# Patient Record
Sex: Male | Born: 1990 | Race: White | Hispanic: No | Marital: Single | Smoking: Current some day smoker
Health system: Southern US, Community
[De-identification: ages and names within clinical notes are randomized; demographics above are authoritative.]

---

## 2016-09-29 ENCOUNTER — Encounter (HOSPITAL_COMMUNITY): Payer: Self-pay | Admitting: Nurse Practitioner

## 2016-09-29 ENCOUNTER — Emergency Department (HOSPITAL_COMMUNITY)
Admission: EM | Admit: 2016-09-29 | Discharge: 2016-09-29 | Disposition: A | Discharge status: Home / Self Care | Payer: 59 | Attending: Emergency Medicine | Admitting: Emergency Medicine

## 2016-09-29 ENCOUNTER — Emergency Department (HOSPITAL_COMMUNITY): Payer: 59

## 2016-09-29 DIAGNOSIS — Y9341 Activity, dancing: Secondary | ICD-10-CM | POA: Insufficient documentation

## 2016-09-29 DIAGNOSIS — Y999 Unspecified external cause status: Secondary | ICD-10-CM | POA: Diagnosis not present

## 2016-09-29 DIAGNOSIS — S42022A Displaced fracture of shaft of left clavicle, initial encounter for closed fracture: Secondary | ICD-10-CM | POA: Diagnosis not present

## 2016-09-29 DIAGNOSIS — W19XXXA Unspecified fall, initial encounter: Secondary | ICD-10-CM

## 2016-09-29 DIAGNOSIS — F17200 Nicotine dependence, unspecified, uncomplicated: Secondary | ICD-10-CM | POA: Diagnosis not present

## 2016-09-29 DIAGNOSIS — Y9289 Other specified places as the place of occurrence of the external cause: Secondary | ICD-10-CM | POA: Diagnosis not present

## 2016-09-29 DIAGNOSIS — W2209XA Striking against other stationary object, initial encounter: Secondary | ICD-10-CM | POA: Diagnosis not present

## 2016-09-29 DIAGNOSIS — S4992XA Unspecified injury of left shoulder and upper arm, initial encounter: Secondary | ICD-10-CM | POA: Diagnosis present

## 2016-09-29 MED ORDER — METHOCARBAMOL 500 MG PO TABS: 500.0000 mg | ORAL_TABLET | Freq: Two times a day (BID) | ORAL | 0 refills | Status: AC

## 2016-09-29 MED ORDER — METHOCARBAMOL 500 MG PO TABS
750.0000 mg | ORAL_TABLET | Freq: Once | ORAL | Status: AC
  Administered 2016-09-29: 750 mg via ORAL
  Filled 2016-09-29: qty 2

## 2016-09-29 MED ORDER — OXYCODONE-ACETAMINOPHEN 5-325 MG PO TABS: 2.0000 | ORAL_TABLET | ORAL | 0 refills | Status: AC | PRN

## 2016-09-29 MED ORDER — HYDROCODONE-ACETAMINOPHEN 5-325 MG PO TABS
2.0000 | ORAL_TABLET | Freq: Once | ORAL | Status: AC
  Administered 2016-09-29: 2 via ORAL
  Filled 2016-09-29: qty 2

## 2016-09-29 NOTE — ED Triage Notes (Signed)
Pt presents with c/o headache. The headache began after he fell and hit his head on a pool table on Saturday. He reports dizziness, feeling uncoordinated, unable to sleep, nausea, and vomiting since the accident. He also c/o L shoulder pain from the fall.

## 2016-09-29 NOTE — ED Notes (Signed)
Visual acuity checked. Bilateral 20/20, Right 20/20, Left 20/20. Pt stated "slight blurriness in upper aspect of the left eye" but does not impede vision.

## 2016-09-29 NOTE — ED Notes (Signed)
Pt was able to tolerate eating and drinking normally. Pt ambulated in hallway. Denies dizziness, unstable gait, or difficulty walking or breathing. States he feels "normal"

## 2016-09-29 NOTE — ED Notes (Signed)
Patient transported to X-ray 

## 2016-09-29 NOTE — ED Provider Notes (Signed)
[facility]-EMERGENCY DEPT Provider Note   CSN: [id number] Arrival date & time: 09/29/16  1236     History   Chief Complaint Chief Complaint  Patient presents with  . Head Injury  . Shoulder Injury    HPI Allen Herman is a 26 y.o. male presents to the ED reporting headache and left shoulder pain s/p mechanical fall 6 days ago. Associated left eye blurred vision, nausea, vomiting, dizziness, dull gradual in onset headache, one brief episode of "balance problems", difficulty sleeping due to shoulder pain. Patient states that last weekend he was at a bar, was dancing and tripped falling on the corner of a pool table striking the left side of his head and shoulder. Patient states that he was intoxicated, does not remember many details of the fall his friends told him he was out for ~5 minutes. Patient remembers the immediate preceding events before the fall, remembers waking up to the paramedics. No anticoagulants. No recent TBI or concussion in the last 4-6 weeks. Patient states that all of his symptoms are progressively improving however his left shoulder pain is only getting worse. No longer nauseous, vomiting or balance issues.  Headache improving.  No numbness, tingling or weakness to the left upper extremity. Patient has been taken Tylenol with minimal relief of left shoulder pain.  HPI  History reviewed. No pertinent past medical history.  There are no active problems to display for this patient.   History reviewed. No pertinent surgical history.     Home Medications    Prior to Admission medications   Medication Sig Start Date End Date Taking? Authorizing Provider  methocarbamol (ROBAXIN) 500 MG tablet Take 1 tablet (500 mg total) by mouth 2 (two) times daily. 09/29/16   Liberty Handylaudia J Marsel Gail, PA-C  oxyCODONE-acetaminophen (PERCOCET/ROXICET) 5-325 MG tablet Take 2 tablets by mouth every 4 (four) hours as needed for severe pain. 09/29/16   Liberty Handylaudia J Ariq Khamis, PA-C    Family  History History reviewed. No pertinent family history.  Social History Social History  Substance Use Topics  . Smoking status: Current Some Day Smoker  . Smokeless tobacco: Never Used  . Alcohol use Yes     Allergies   Patient has no known allergies.   Review of Systems Review of Systems  Constitutional: Negative for chills and fever.  HENT: Negative for congestion and sore throat.   Eyes: Positive for visual disturbance. Negative for photophobia, pain and redness.  Respiratory: Negative for cough and shortness of breath.   Cardiovascular: Negative for chest pain and palpitations.  Gastrointestinal: Positive for nausea and vomiting. Negative for abdominal pain, blood in stool, constipation and diarrhea.  Genitourinary: Negative for difficulty urinating, flank pain and hematuria.  Musculoskeletal: Positive for arthralgias and neck pain. Negative for joint swelling, myalgias and neck stiffness.  Skin: Positive for color change and wound. Negative for rash.  Neurological: Positive for headaches. Negative for dizziness, seizures, syncope, weakness, light-headedness and numbness.  Hematological: Negative.   Psychiatric/Behavioral: Negative.      Physical Exam Updated Vital Signs BP (!) 152/137   Pulse (!) 104   Temp 98.4 F (36.9 C) (Oral)   Resp (!) 22   Ht 6\' 2"  (1.88 m)   Wt 97.5 kg   SpO2 100%   BMI 27.60 kg/m   Physical Exam  Constitutional: He is oriented to person, place, and time. He appears well-developed and well-nourished. He is cooperative.  Non-toxic appearance. No distress.  HENT:  Head: Normocephalic and atraumatic.  Nose: Nose  normal.  Mouth/Throat: Oropharynx is clear and moist and mucous membranes are normal. No oropharyngeal exudate or posterior oropharyngeal erythema. Tonsils are 1+ on the right. Tonsils are 1+ on the left.  No Raccoon's eyes.  No Battle's sign. Green ecchymosis above left eye brow with tiny <0.5cm crusted abrasion. No  hemotympanum.  Temporal bones non tender.  Occipital prominence non tender. No nasal bone tenderness. Septum midline without hematoma.  Eyes: Conjunctivae, EOM and lids are normal. Pupils are equal, round, and reactive to light.  PERRL and EOMs intact. No pain with EOMs.  No nystagmus.  Globes soft without tenderness.  Neck: Normal range of motion and full passive range of motion without pain. Neck supple. No JVD present. Muscular tenderness present. No spinous process tenderness present. No neck rigidity. No tracheal deviation and normal range of motion present.    Muscular tenderness, full active neck ROM. No cervical spinous process tenderness.  No meningeal signs.  No carotid bruits.  Cardiovascular: Normal rate, regular rhythm, S1 normal, S2 normal, normal heart sounds and intact distal pulses.   No murmur heard. Pulses:      Carotid pulses are 2+ on the right side, and 2+ on the left side.      Radial pulses are 2+ on the right side, and 2+ on the left side.       Dorsalis pedis pulses are 2+ on the right side, and 2+ on the left side.  Strong left radial and ulnar pulses  Pulmonary/Chest: Effort normal and breath sounds normal. No respiratory distress. He has no decreased breath sounds. He has no wheezes. He has no rhonchi. He has no rales. He exhibits no tenderness.  Abdominal: Soft. Bowel sounds are normal. There is no tenderness.  Musculoskeletal: He exhibits no deformity.       Left shoulder: He exhibits decreased range of motion, tenderness, bony tenderness, pain and decreased strength. He exhibits no crepitus.       Arms: Tenderness at distal left clavicle, biceps tendon groove and anterior deltoid.  Green ecchymosis over distal left clavicle and anterior shoulder  Full active left shoulder abduction and adduction, and forearm pronation and supination  Decreased active left shoulder flexion and extension  Lymphadenopathy:    He has no cervical adenopathy.  Neurological:  He is alert and oriented to person, place, and time.  5/5 strength with shoulder raise, abduction and adduction, bilaterally.  4/5 strength with left shoulder flexion  Good pincer and hand grip bilaterally.  Sensation to light touch intact in median, ulnar and radial nerve distribution, bilaterally.   Skin: Skin is warm and dry. Capillary refill takes less than 2 seconds.  Psychiatric: He has a normal mood and affect. His behavior is normal. Judgment and thought content normal.  Nursing note and vitals reviewed.    ED Treatments / Results  Labs (all labs ordered are listed, but only abnormal results are displayed) Labs Reviewed - No data to display  EKG  EKG Interpretation None       Radiology Dg Clavicle Left  Result Date: 09/29/2016 CLINICAL DATA:  Superior and anterior left shoulder pain after falling and hitting his left arm on a pool table. EXAM: LEFT CLAVICLE - 2+ VIEWS COMPARISON:  Left shoulder radiographs obtained at the same time. FINDINGS: Fracture of the distal aspect of the left clavicle with very minimal superior displacement of the distal fragment. IMPRESSION: Minimally displaced distal clavicle fracture. Electronically Signed   By: Beckie Salts M.D.   On: 09/29/2016  17:23   Dg Shoulder Left  Result Date: 09/29/2016 CLINICAL DATA:  Superior and anterior left shoulder pain after falling and hitting his left arm on a pool table. EXAM: LEFT SHOULDER - 2+ VIEW COMPARISON:  Left clavicle radiographs obtained at the same time. FINDINGS: Previously described minimally displaced distal left clavicle fracture. No glenohumeral fracture or dislocation. IMPRESSION: Minimally displaced distal left clavicle fracture. Electronically Signed   By: Beckie Salts M.D.   On: 09/29/2016 17:24    Procedures Procedures (including critical care time)  Medications Ordered in ED Medications  HYDROcodone-acetaminophen (NORCO/VICODIN) 5-325 MG per tablet 2 tablet (2 tablets Oral Given 09/29/16  1654)  methocarbamol (ROBAXIN) tablet 750 mg (750 mg Oral Given 09/29/16 1655)     Initial Impression / Assessment and Plan / ED Course  I have reviewed the triage vital signs and the nursing notes.  Pertinent labs & imaging results that were available during my care of the patient were reviewed by me and considered in my medical decision making (see chart for details).  Clinical Course as of Sep 30 2049  Fri Sep 29, 2016  1729 FINDINGS: Fracture of the distal aspect of the left clavicle with very minimal superior displacement of the distal fragment.  IMPRESSION: Minimally displaced distal clavicle fracture. DG Clavicle Left [CG]  1730 FINDINGS: Previously described minimally displaced distal left clavicle fracture. No glenohumeral fracture or dislocation.  IMPRESSION: Minimally displaced distal left clavicle fracture. DG Shoulder Left [CG]    Clinical Course User Index [CG] Liberty Handy, PA-C   Injury occurred 6 days ago with mostly resolved symptoms. C-spine cleared with Nexus criteria. Discussed CT scan of head with supervising physician.  Given reassuring vital signs, no focal neurological findings on exam, no thunderclap sudden or worsening headache, vision changes, confusion, lethargy, seizures, slurred speech, memory deficits, disorientation I have low suspicion of acute intracranial injury.  No signs of basilar skull fracture.  CT scan was not considered indicated.  Long discussion with patient discussing risks vs benefits of CT scan today, shared decision making took place. Provided my recommendations and patient was agreeable to withhold CT scan head.  Considered carotid dissection but no sudden onset neck pain or headache immediately after fall, muscular tenderness "feels better with massage" and is improving. Patient considered safe for discharge with close f/u with PCP and orthopedist for distal left clavicular fracture.  Sling in place. Short course of percocet given.  Strict ED return precautions.  Patient agreeable. Patient tolerated PO challenge, ambulated in unit without complications.   Final Clinical Impressions(s) / ED Diagnoses   Final diagnoses:  Fall, initial encounter  Closed displaced fracture of shaft of left clavicle, initial encounter    New Prescriptions Discharge Medication List as of 09/29/2016  6:51 PM    START taking these medications   Details  oxyCODONE-acetaminophen (PERCOCET/ROXICET) 5-325 MG tablet Take 2 tablets by mouth every 4 (four) hours as needed for severe pain., Starting Fri 09/29/2016, Print         Liberty Handy, PA-C 09/30/16 2052    Mancel Bale, MD 10/02/16 [phone]

## 2016-09-29 NOTE — Progress Notes (Signed)
Orthopedic Tech Progress Note Patient Details:  [facility]ell [facility] 26-Mar-1991 [id number]  Ortho Devices Type of Ortho Device: Shoulder immobilizer Ortho Device/Splint Location: LUE Ortho Device/Splint Interventions: Ordered, Application   Jennye MoccasinHughes, Shonn Farruggia Craig 09/29/2016, 7:09 PM

## 2016-09-29 NOTE — ED Notes (Signed)
Ortho tech at bedside 

## 2016-09-29 NOTE — Discharge Instructions (Signed)
As we discussed your x-ray showed a distal clavicle fracture. Please wear your sling, take prescribed Percocet for severe pain and ice/heat for comfort and inflammation. If your pain is mild or moderate you may take 600 mg of ibuprofen +1000 mg of Tylenol up to 3 times a day. Please call orthopedic surgeon to make an appointment for re-evaluation within 7-10 days to ensure that your clavicle is healing well.  We had a long discussion today about the need of a CT scan of your head given your recent fall. After long discussion with Dr. Effie ShyWentz, we recommended conservative approach today and did not perform a CT scan. Please monitor your symptoms and ensure that they are improving and eventually resolved. Return to the emergency department if you develop severe headache, not intractable nausea or vomiting or any other neurological deficits.

## 2016-12-06 ENCOUNTER — Other Ambulatory Visit: Payer: Self-pay | Admitting: Family Medicine

## 2016-12-06 DIAGNOSIS — R740 Nonspecific elevation of levels of transaminase and lactic acid dehydrogenase [LDH]: Principal | ICD-10-CM

## 2016-12-06 DIAGNOSIS — R7401 Elevation of levels of liver transaminase levels: Secondary | ICD-10-CM

## 2016-12-14 ENCOUNTER — Other Ambulatory Visit: Payer: 59

## 2016-12-18 ENCOUNTER — Other Ambulatory Visit: Payer: 59

## 2018-01-14 IMAGING — DX DG CLAVICLE*L*
2 series · 2 of 2 positions shown · non-contrast
Comparison: Left shoulder radiographs obtained at the same time.

CLINICAL DATA: Superior and anterior left shoulder pain after
falling and hitting his left arm on a pool table.

EXAM:
LEFT CLAVICLE - 2+ VIEWS

[clavicle ap]
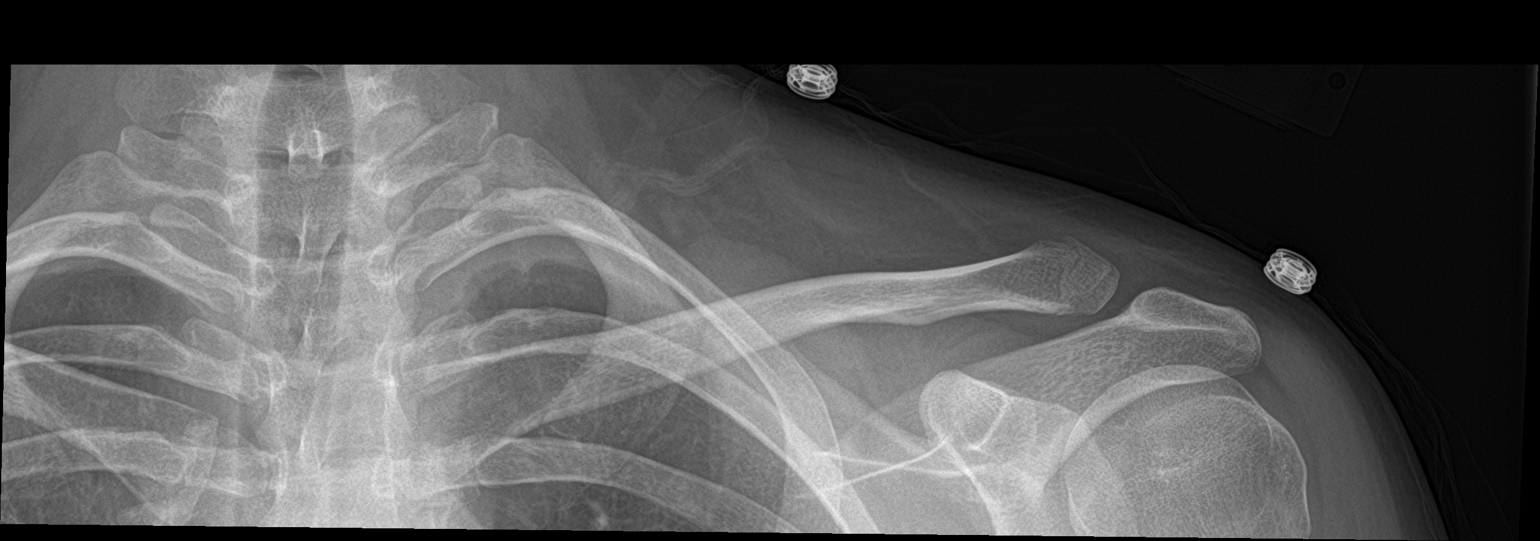

[clavicle axial]
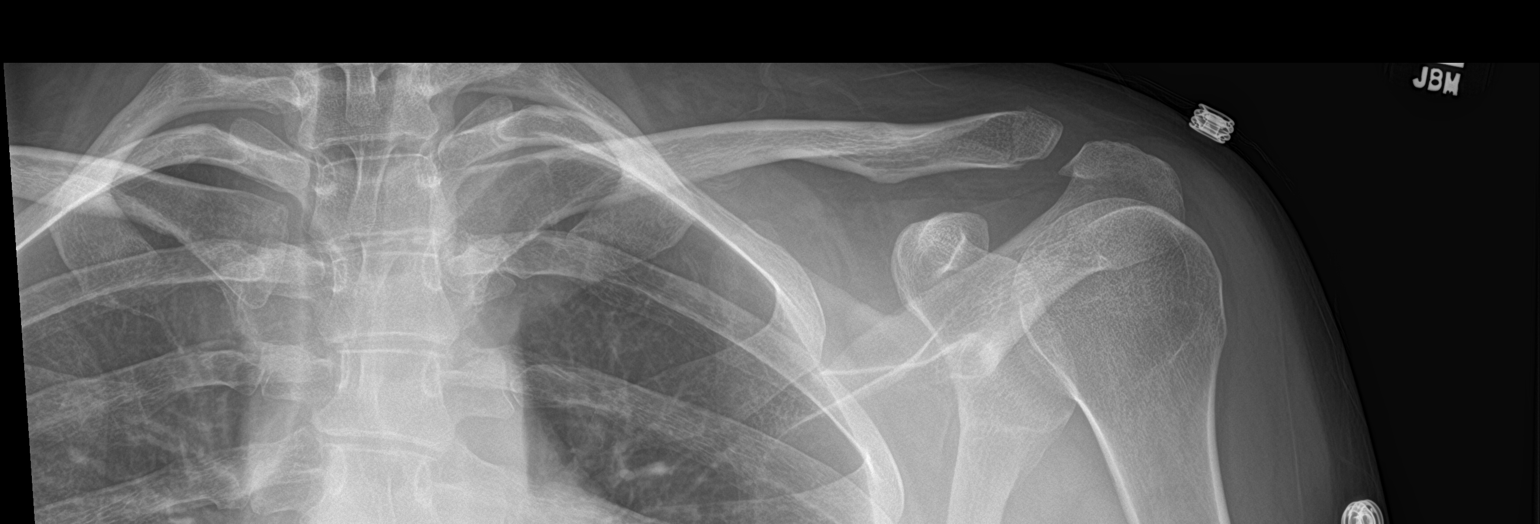

[2 of 2 positions shown; findings below may reference images not displayed]

FINDINGS: Fracture of the distal aspect of the left clavicle with very minimal
superior displacement of the distal fragment.
IMPRESSION: Minimally displaced distal clavicle fracture.

## 2018-01-14 IMAGING — DX DG SHOULDER 2+V*L*
3 series · 3 of 3 positions shown · non-contrast
Comparison: Left clavicle radiographs obtained at the same time.

CLINICAL DATA: Superior and anterior left shoulder pain after
falling and hitting his left arm on a pool table.

EXAM:
LEFT SHOULDER - 2+ VIEW

[shoulder grashey]
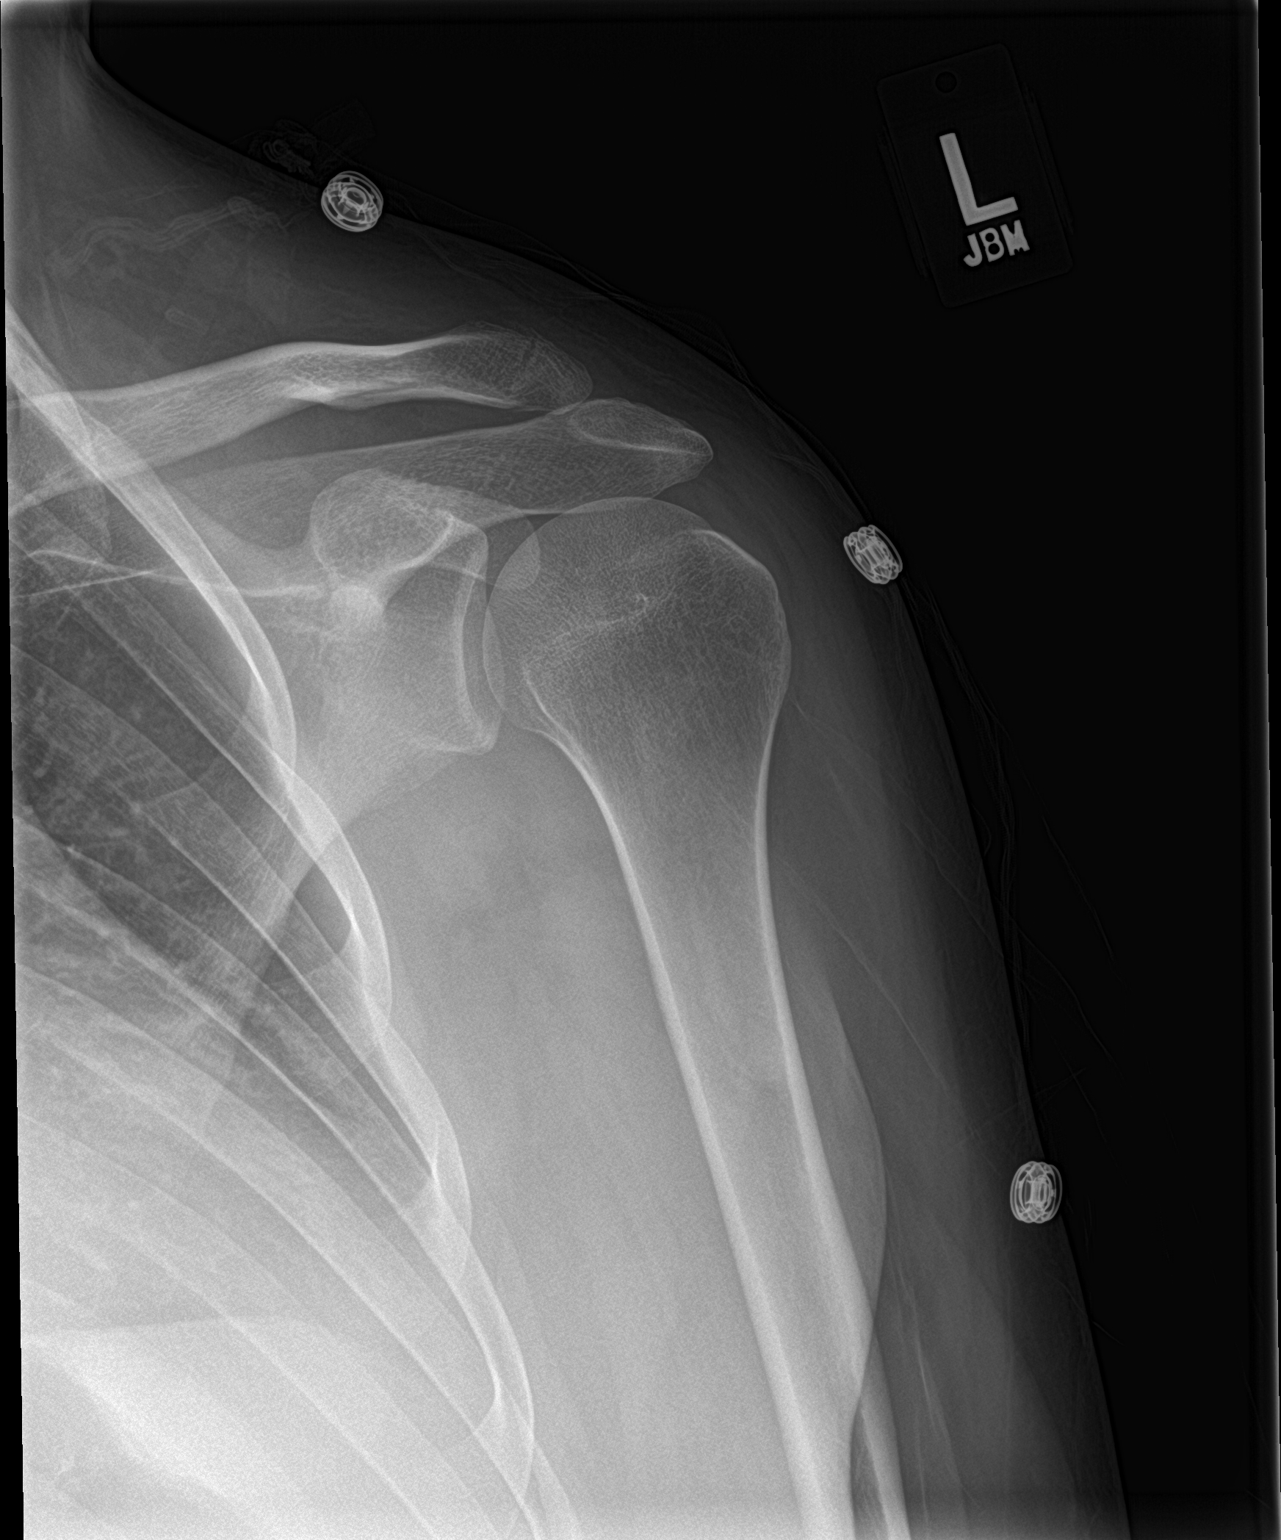

[shoulder y view]
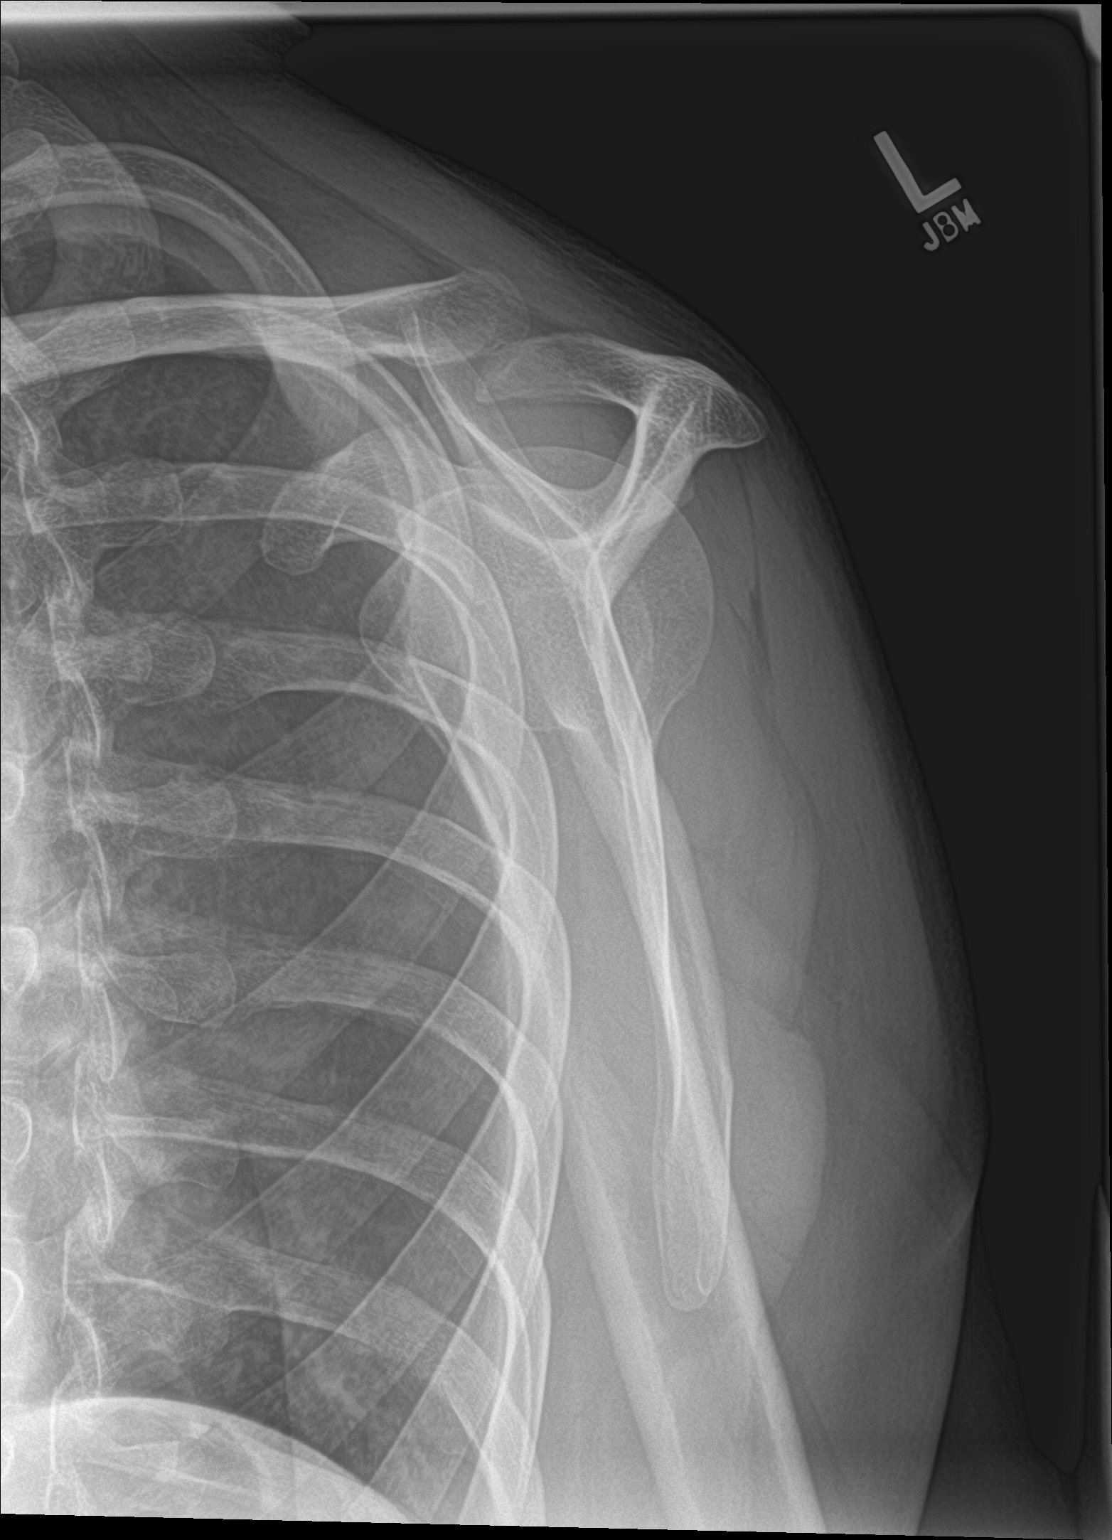

[shoulder axillary]
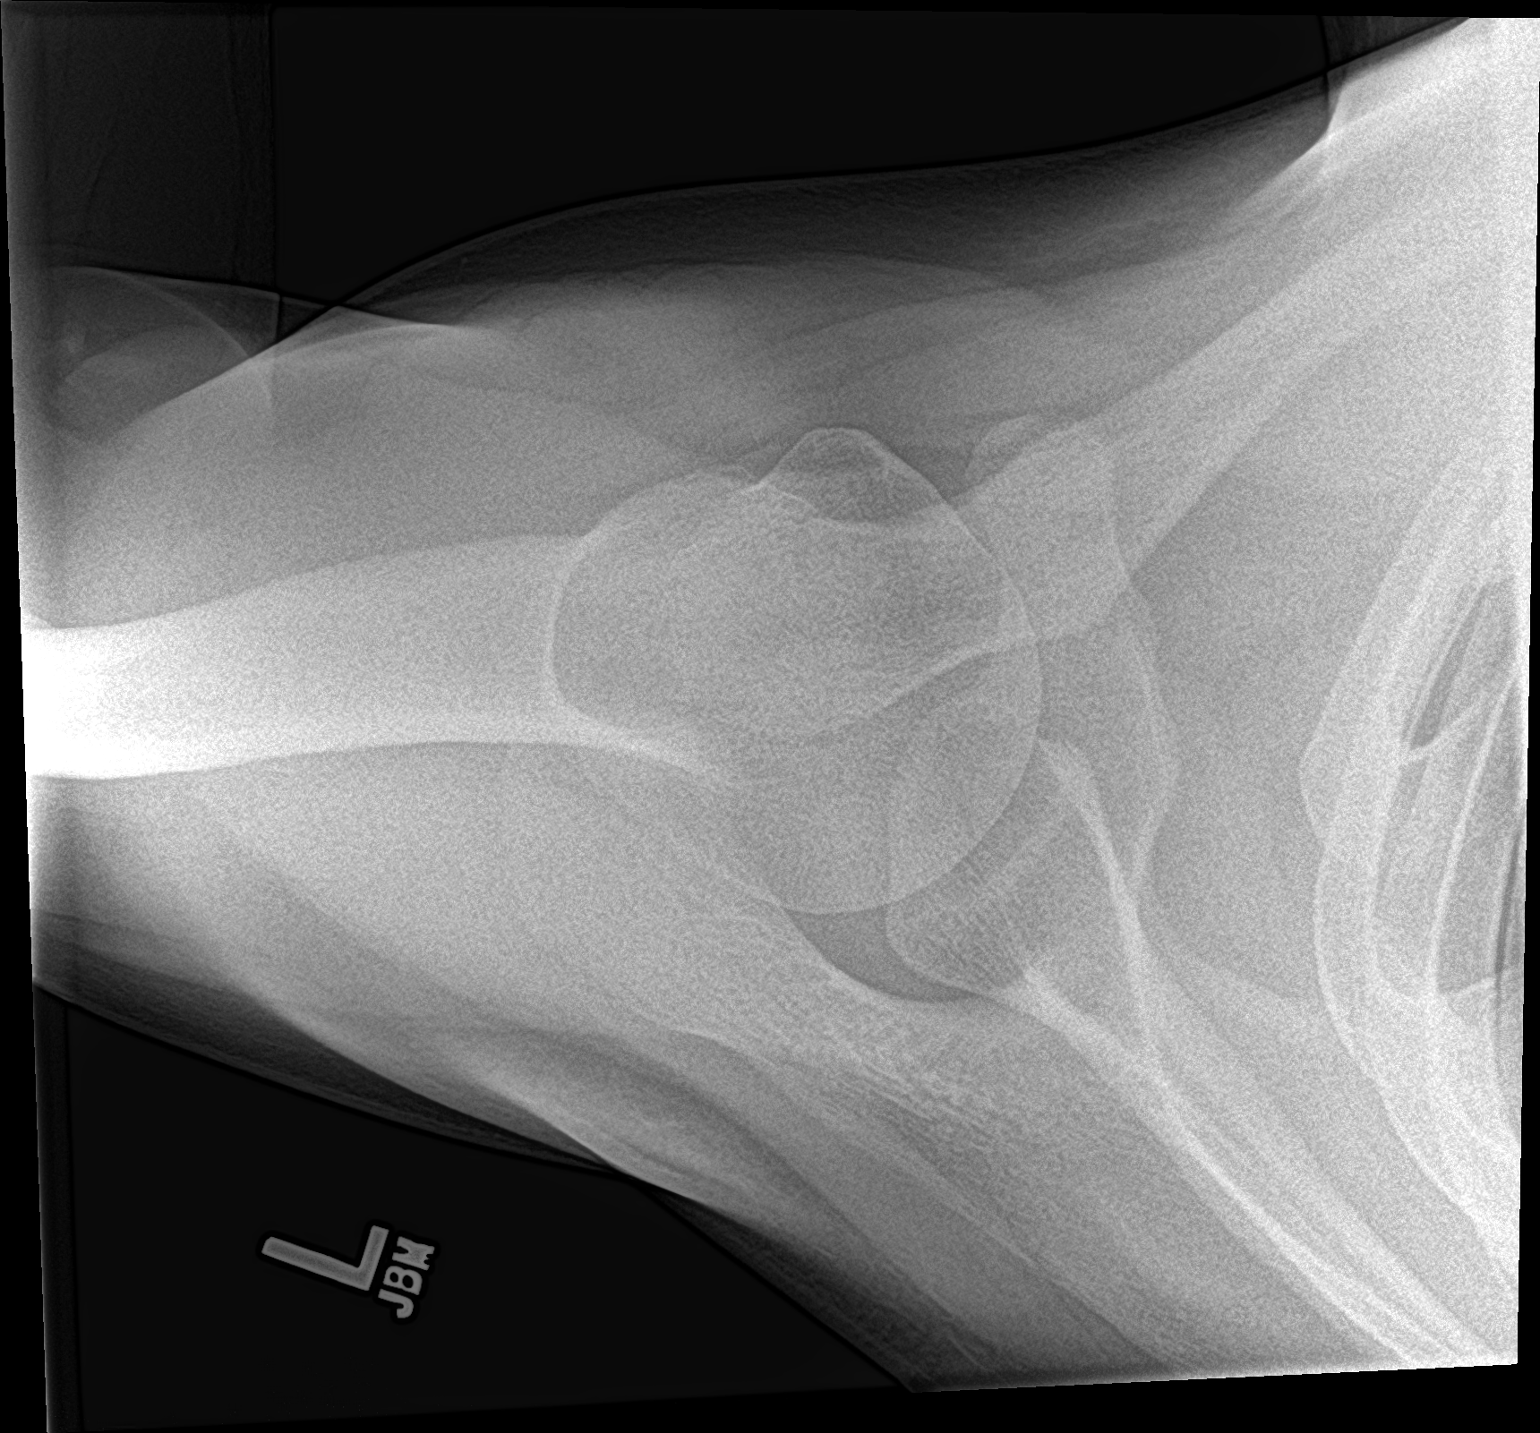

[3 of 3 positions shown; findings below may reference images not displayed]

FINDINGS: Previously described minimally displaced distal left clavicle
fracture. No glenohumeral fracture or dislocation.
IMPRESSION: Minimally displaced distal left clavicle fracture.
